# Patient Record
Sex: Male | Born: 1960 | Race: Black or African American | Hispanic: No | Marital: Married | Smoking: Never smoker
Health system: Southern US, Community
[De-identification: ages and names within clinical notes are randomized; demographics above are authoritative.]

---

## 2014-12-04 ENCOUNTER — Encounter (HOSPITAL_COMMUNITY): Payer: Self-pay | Admitting: Emergency Medicine

## 2014-12-04 ENCOUNTER — Emergency Department (HOSPITAL_COMMUNITY)
Admission: EM | Admit: 2014-12-04 | Discharge: 2014-12-04 | Disposition: A | Discharge status: Home / Self Care | Payer: 59 | Attending: Emergency Medicine | Admitting: Emergency Medicine

## 2014-12-04 ENCOUNTER — Emergency Department (HOSPITAL_COMMUNITY): Payer: 59

## 2014-12-04 DIAGNOSIS — R109 Unspecified abdominal pain: Secondary | ICD-10-CM

## 2014-12-04 DIAGNOSIS — Z87442 Personal history of urinary calculi: Secondary | ICD-10-CM

## 2014-12-04 DIAGNOSIS — R1030 Lower abdominal pain, unspecified: Secondary | ICD-10-CM | POA: Diagnosis present

## 2014-12-04 DIAGNOSIS — N200 Calculus of kidney: Secondary | ICD-10-CM | POA: Diagnosis not present

## 2014-12-04 LAB — URINALYSIS, ROUTINE W REFLEX MICROSCOPIC
Bilirubin Urine: NEGATIVE
Glucose, UA: NEGATIVE mg/dL
HGB URINE DIPSTICK: NEGATIVE
KETONES UR: NEGATIVE mg/dL
LEUKOCYTES UA: NEGATIVE
Nitrite: NEGATIVE
PROTEIN: NEGATIVE mg/dL
Specific Gravity, Urine: 1.027 (ref 1.005–1.030)
UROBILINOGEN UA: 1 mg/dL (ref 0.0–1.0)
pH: 5.5 (ref 5.0–8.0)

## 2014-12-04 LAB — COMPREHENSIVE METABOLIC PANEL
ALK PHOS: 27 U/L — AB (ref 38–126)
ALT: 19 U/L (ref 17–63)
ANION GAP: 12 (ref 5–15)
AST: 32 U/L (ref 15–41)
Albumin: 4.1 g/dL (ref 3.5–5.0)
BILIRUBIN TOTAL: 1.1 mg/dL (ref 0.3–1.2)
BUN: 10 mg/dL (ref 6–20)
CALCIUM: 9.3 mg/dL (ref 8.9–10.3)
CO2: 24 mmol/L (ref 22–32)
CREATININE: 1.59 mg/dL — AB (ref 0.61–1.24)
Chloride: 102 mmol/L (ref 101–111)
GFR, EST AFRICAN AMERICAN: 56 mL/min — AB (ref >60)
GFR, EST NON AFRICAN AMERICAN: 48 mL/min — AB (ref >60)
Glucose, Bld: 157 mg/dL — ABNORMAL HIGH (ref 65–99)
Potassium: 3.2 mmol/L — ABNORMAL LOW (ref 3.5–5.1)
Sodium: 138 mmol/L (ref 135–145)
TOTAL PROTEIN: 7.6 g/dL (ref 6.5–8.1)

## 2014-12-04 LAB — CBC
HCT: 39.3 % (ref 39.0–52.0)
HEMOGLOBIN: 13.2 g/dL (ref 13.0–17.0)
MCH: 29.7 pg (ref 26.0–34.0)
MCHC: 33.6 g/dL (ref 30.0–36.0)
MCV: 88.3 fL (ref 78.0–100.0)
PLATELETS: 278 10*3/uL (ref 150–400)
RBC: 4.45 MIL/uL (ref 4.22–5.81)
RDW: 13.3 % (ref 11.5–15.5)
WBC: 4.7 10*3/uL (ref 4.0–10.5)

## 2014-12-04 LAB — LIPASE, BLOOD: Lipase: 17 U/L — ABNORMAL LOW (ref 22–51)

## 2014-12-04 MED ORDER — MORPHINE SULFATE (PF) 4 MG/ML IV SOLN
4.0000 mg | Freq: Once | INTRAVENOUS | Status: AC
  Administered 2014-12-04: 4 mg via INTRAVENOUS
  Filled 2014-12-04: qty 1

## 2014-12-04 MED ORDER — ONDANSETRON HCL 4 MG/2ML IJ SOLN
4.0000 mg | Freq: Once | INTRAMUSCULAR | Status: AC | PRN
  Administered 2014-12-04: 4 mg via INTRAVENOUS
  Filled 2014-12-04: qty 2

## 2014-12-04 MED ORDER — OXYCODONE-ACETAMINOPHEN 5-325 MG PO TABS: 1.0000 | ORAL_TABLET | ORAL | Status: AC | PRN

## 2014-12-04 MED ORDER — KETOROLAC TROMETHAMINE 30 MG/ML IJ SOLN
30.0000 mg | Freq: Once | INTRAMUSCULAR | Status: AC
  Administered 2014-12-04: 30 mg via INTRAVENOUS
  Filled 2014-12-04: qty 1

## 2014-12-04 MED ORDER — ONDANSETRON HCL 4 MG PO TABS: 4.0000 mg | ORAL_TABLET | Freq: Four times a day (QID) | ORAL | Status: AC

## 2014-12-04 MED ORDER — POTASSIUM CHLORIDE CRYS ER 20 MEQ PO TBCR
40.0000 meq | EXTENDED_RELEASE_TABLET | Freq: Once | ORAL | Status: AC
  Administered 2014-12-04: 40 meq via ORAL
  Filled 2014-12-04: qty 2

## 2014-12-04 NOTE — ED Provider Notes (Signed)
Complains of pain in her right suprapubic area gradual onset last night. He feels much improved presently, since treatment in the emergency department. Patient is alert, Glasgow Coma Scale 15 no distress. Patient notified of abnormal creatinine, hyperglycemia and renal stone.  Doug Sou, MD 12/04/14 [phone]

## 2014-12-04 NOTE — ED Provider Notes (Signed)
CSN: [id number]     Arrival date & time 12/04/14  0617 History   First MD Initiated Contact with Patient 12/04/14 [phone]     Chief Complaint  Patient presents with  . Abdominal Pain  . Emesis   HPI  Mr. Winner is a 54 year old male with PMHx of kidney stones presenting with lower abdominal pain. Pt reports gradual onset of abdominal pain around 11 PM last evening that has been worsening in severity since. Pain is suprapubic and described as "pressure". Pain scale is 10/10. Pt states that movement makes the pain worse and he cannot get comfortable. He has a history of 2 or 3 kidney stones in the past and states that this pain feels similar. Pt is also complaining of nausea and vomiting that began with abdominal pain. Vomit is NBNB. Denies fevers, chills, back pain, flank pain, diarrhea, constipation, dysuria, decreased urinary volume, hematuria, penile discharge, penile pain or testicular pain. Pt states he gets short of breath when the pain intensifies. Denies chest pain, palpitations, dizziness or lightheadedness.   History reviewed. No pertinent past medical history. History reviewed. No pertinent past surgical history. No family history on file. Social History  Substance Use Topics  . Smoking status: Never Smoker   . Smokeless tobacco: None  . Alcohol Use: Yes     Comment: social    Review of Systems  Constitutional: Negative for fever and chills.  Respiratory: Positive for shortness of breath.   Cardiovascular: Negative for chest pain and palpitations.  Gastrointestinal: Positive for nausea, vomiting and abdominal pain. Negative for diarrhea and constipation.  Genitourinary: Negative for dysuria, frequency, hematuria, flank pain, decreased urine volume, discharge, difficulty urinating, penile pain and testicular pain.  Musculoskeletal: Negative for myalgias and back pain.  Neurological: Negative for dizziness and light-headedness.      Allergies  Bactrim  Home Medications    Prior to Admission medications   Medication Sig Start Date End Date Taking? Authorizing Provider  ondansetron (ZOFRAN) 4 MG tablet Take 1 tablet (4 mg total) by mouth every 6 (six) hours. 12/04/14   Stevi Barrett, PA-C  oxyCODONE-acetaminophen (PERCOCET/ROXICET) 5-325 MG per tablet Take 1-2 tablets by mouth every 4 (four) hours as needed for severe pain. 12/04/14   Stevi Barrett, PA-C   BP 134/66 mmHg  Pulse 71  Temp(Src) 98.3 F (36.8 C) (Oral)  Resp 21  Ht  (1.905 m)  Wt 240 lb (108.863 kg)  BMI 30.00 kg/m2  SpO2 100% Physical Exam  Constitutional: He appears well-developed and well-nourished. He appears distressed (Uncomfortable appearing).  HENT:  Head: Normocephalic and atraumatic.  Eyes: Conjunctivae are normal. Right eye exhibits no discharge. Left eye exhibits no discharge. No scleral icterus.  Neck: Normal range of motion.  Cardiovascular: Normal rate, regular rhythm and normal heart sounds.   No murmur heard. Pulmonary/Chest: Effort normal and breath sounds normal. No respiratory distress. He has no wheezes. He has no rales.  Tachypneic   Abdominal: Soft. Bowel sounds are normal. He exhibits no distension. There is tenderness (suprapubic, RLQ). There is no rebound and no guarding.  No CVA tenderness.   Musculoskeletal: Normal range of motion.  Moves all extremities spontaneously.  Neurological: He is alert. Coordination normal.  Skin: Skin is warm and dry.  Psychiatric: He has a normal mood and affect. His behavior is normal.  Nursing note and vitals reviewed.   ED Course  Procedures (including critical care time) Labs Review Labs Reviewed  LIPASE, BLOOD - Abnormal; Notable for  the following:    Lipase 17 (*)    All other components within normal limits  COMPREHENSIVE METABOLIC PANEL - Abnormal; Notable for the following:    Potassium 3.2 (*)    Glucose, Bld 157 (*)    Creatinine, Ser 1.59 (*)    Alkaline Phosphatase 27 (*)    GFR calc non Af Amer 48 (*)     GFR calc Af Amer 56 (*)    All other components within normal limits  URINALYSIS, ROUTINE W REFLEX MICROSCOPIC (NOT AT [facility]) - Abnormal; Notable for the following:    Color, Urine AMBER (*)    APPearance CLOUDY (*)    All other components within normal limits  CBC    Imaging Review Ct Renal Stone Study  12/04/2014   CLINICAL DATA:  C/o lower central abdominal pain most of the night, no worse on either side per pt, no gross hematuria, urinalysis pending at time of ct, hx of kidney stones  EXAM: CT ABDOMEN AND PELVIS WITHOUT CONTRAST  TECHNIQUE: Multidetector CT imaging of the abdomen and pelvis was performed following the standard protocol without IV contrast.  COMPARISON:  None.  FINDINGS: Lower chest:  Negative  Hepatobiliary: Multiple low-attenuation lesions throughout the liver which cannot be fully characterized without contrast. Most are less than a cm in size. In the medial left lobe the dominant lesion is 2.4 cm. Gallbladder is normal.  Pancreas: Normal  Spleen: Normal  Adrenals/Urinary Tract: Normal adrenal glands. Normal left kidney. Right kidney shows mild perinephric inflammation. There is a 4 mm nonobstructing upper pole stone. There is moderate dilatation of the right ureter. There is a 7 mm stone in the distal right ureter about 1.5 cm proximal to the ureterovesical junction. Bladder is decompressed.  Stomach/Bowel: Normal  Vascular/Lymphatic: Normal  Reproductive: Normal  Other: No ascites  Musculoskeletal: No acute findings  IMPRESSION: Hydronephrosis on the right due to distal ureteral stone.  Multiple liver lesions likely cysts but this would require contrast-enhanced study to characterize. Recommend contrast-enhanced CT scan or nonemergent hepatic MRI.   Electronically Signed   By: Esperanza Heir M.D.   On: 12/04/2014 07:16   I have personally reviewed and evaluated these images and lab results as part of my medical decision-making.   EKG Interpretation None      MDM    Final diagnoses:  History of kidney stones  Abdominal pain  Nephrolithiasis   6:30 - Pt presenting with 8 hour history of worsening suprapubic pain with nausea and vomiting. Pt with history of kidney stones. VSS. Pt very uncomfortable appearing, frequently moving around on the stretcher. Abdomen is soft with suprapubic and RLQ tenderness. Will get CBC, CMP, lipase, UA and CT renal stone study. Zofran, toradol and morphine given for symptom control. 7:15 - CT renal study showing hydronephrosis of right kidney 2/2 distal 7 mm ureteral stone. Reassessed pt at this time. Reports significant improvement in pain after toradol injection. Abdomen is soft with mild tenderness over suprapubic/RLQ. Creatinine 1.59. Pt is not uroseptic, anuric or with intractable pain/nausea. Will discharge with percocet, zofran, strainer and urology referral. Pt instructed to call urology later today to schedule follow up appointment. Pt is moving to [place] next week, encouraged to establish primary care there for a recheck of kidney function in the next month. Pt agrees with this plan. Stable for discharge.   Rolm Gala Barrett, PA-C 12/04/14 1610  Doug Sou, MD 12/04/14 [phone]

## 2014-12-04 NOTE — Discharge Instructions (Signed)
- Call urology today to schedule a follow up appointment for this week - Establish primary care doctor ASAP. Today your creatinine was 1.58 and glucose was 157. Ask your primary care doctor to recheck your kidney function and get a Hemoglobin A1C within the next month.  Emergency Department Resource Guide 1) Find a Doctor and Pay Out of Pocket Although you won't have to find out who is covered by your insurance plan, it is a good idea to ask around and get recommendations. You will then need to call the office and see if the doctor you have chosen will accept you as a new patient and what types of options they offer for patients who are self-pay. Some doctors offer discounts or will set up payment plans for their patients who do not have insurance, but you will need to ask so you aren't surprised when you get to your appointment.  2) Contact Your Local Health Department Not all health departments have doctors that can see patients for sick visits, but many do, so it is worth a call to see if yours does. If you don't know where your local health department is, you can check in your phone book. The CDC also has a tool to help you locate your state's health department, and many state websites also have listings of all of their local health departments.  3) Find a Walk-in Clinic If your illness is not likely to be very severe or complicated, you may want to try a walk in clinic. These are popping up all over the country in pharmacies, drugstores, and shopping centers. They're usually staffed by nurse practitioners or physician assistants that have been trained to treat common illnesses and complaints. They're usually fairly quick and inexpensive. However, if you have serious medical issues or chronic medical problems, these are probably not your best option.  No Primary Care Doctor: - Call Health Connect at  [phone] - they can help you locate a primary care doctor that  accepts your insurance, provides  certain services, etc. - Physician Referral Service- [phone]  Chronic Pain Problems: Organization         Address  Phone   Notes  [facility]  [phone] Patients need to be referred by their primary care doctor.   Medication Assistance: Organization         Address  Phone   Notes  [facility] Medication [facility] [address] [place] [place], [place] [address] [phone] --Must be a resident of [facility] -- Must have NO insurance coverage whatsoever (no Medicaid/ Medicare, etc.) -- The pt. MUST have a primary care doctor that directs their care regularly and follows them in the community   [facility]  [phone]   [employer]  [phone]    Agencies that provide inexpensive medical care: Organization         Address  Phone   Notes  [facility]  [phone]   [facility]    [phone]   [facility] [address] [place], [place] [address] [phone]   [facility] [address] [place] [place] [phone]   Planned Parenthood    [phone]   [facility]    [phone]   Community Health and [facility]  [address], [place] Phone:  [phone], Fax:  [phone] Hours of Operation:  9 am - 6 pm,  M-F.  Also accepts Medicaid/Medicare and self-pay.  [facility]  [address], [place] Phone: [phone], Fax: [phone]. Hours of Operation:  8:30 am - 5:30 pm, M-F.  Also accepts Medicaid and self-pay.  [facility] [place] [address], [place] Phone: [phone]   [facility] [address], [place], [place] [phone], Ext. [phone] Mondays & Thursdays: 7-9 AM.  First 15 patients are seen on a first come, first serve basis.    Medicaid-accepting [facility] Providers:  Organization         Address  Phone   Notes  [facility] [address], [place] [phone]) [phone] Also accepts self-pay patients.  [facility] [address], [place]  [phone]) [phone]   [facility] [address], [place] [phone]   [facility] [address] [place] [address], [place] [phone]) [phone]   Renaye RakersVeita Bland [address], [place]   [phone] Only accepts [place] Access [place] patients after they have their name applied to their card.   Self-Pay (no insurance) in [facility]:  Organization         Address  Phone   Notes  Sickle Cell Patients, [facility] [address], [place] [phone]) [phone]   [facility] [address], [place] [phone]) [phone]   [facility]  [address], [place] [phone]) [phone]   [facility]/Dr. Osei-Bonsu  [address] [place] [address], [place] or [phone], [place] [phone] Phone number for both [place] and [place] locations is the same.  [facility]gent [facility] and [facility] [address], [place] [phone]   [facility] [address] [place] [address], [place] or [address] [phone](336) [phone]   [facility] [address], [place] [phone]) [phone], phone; [phone], fax Sees patients 1st and 3rd Saturday of every month.  Must not qualify for public or private insurance (i.e. Medicaid, Medicare, [place] Health Choice, Veterans' Benefits)  Household income should be no more than 200% of the poverty level The clinic cannot treat you if you are pregnant or think you are pregnant  Sexually transmitted diseases are not treated at the clinic.    Dental Care: Organization         Address  Phone  Notes  [facility] [address], [place] [phone]  Accepts children up to age 53 who are enrolled in [place] or [place] Health Choice; pregnant women with a Medicaid card; and children who have applied for Medicaid or [place] Health Choice, but were declined, whose parents can pay a reduced fee at time of service.  [facility] of [facility] [place]  [address], [place] [phone]) [phone] Accepts children up to age 53 who are enrolled in [place] or [place] Health Choice; pregnant women with a Medicaid card; and children who have applied for Medicaid or [place] Health Choice, but were declined, whose parents can pay a reduced fee at time of service.  [facility]  [address], [place] [phone] Patients are seen by appointment only. Walk-ins are not accepted. [facility] will see patients 53 years of age and older. Monday -  Tuesday (8am-5pm) Most Wednesdays (8:30-5pm) $30 per visit, cash only  [facility]  [address], [facility] [phone]) [phone] Patients are seen by appointment only. Walk-ins are not accepted. [facility] will see patients 53 years of age and older. One Wednesday Evening (Monthly: Volunteer Based).  $30 per visit, cash only  [employer] [facility] [facility]  [phone] for adults; Children under age 954, call Graduate Pediatric Dentistry at [phone]) [phone]. Children aged 274-14, please call [phone] to request a pediatric application.  Dental services are provided in all areas of dental care including fillings, crowns and bridges, complete and partial dentures, implants, gum treatment, root canals, and extractions. Preventive care is also provided. Treatment is provided to both adults and children. Patients are selected via a lottery and there is often a waiting list.   [facility] [address], [place]  [phone]) [phone] [email or web address]   [facility] [address], [place], [place] [phone], Ext. [phone] Second  and Fourth Thursday of each month, opens at 6:30 AM; Clinic ends at 9 AM.  Patients are seen on a first-come first-served basis, and a limited number are seen during each clinic.   [facility]  [address], [place], [place] [phone]) [phone]   Eligibility Requirements You must have lived in [place], [place], or [place] counties for at least the last three months.   You cannot be eligible for state or federal sponsored National Cityhealthcare insurance, including CIGNAVeterans Administration, [place]icaid, or [employer] EntertainmentMedicare.   You generally cannot be eligible for healthcare insurance through your employer.    How to apply: Eligibility screenings are held every Tuesday and Wednesday afternoon from 1:00 pm until 4:00 pm. You do not need an appointment for the interview!  [facility] [address], [place], [place] [phone]   [facility]  [phone]   [facility]  [phone]   [facility]  [phone]    Behavioral Health Resources in the Community: Intensive Outpatient Programs Organization         Address  Phone  Notes  [facility] [address], [place], [place] [phone]   [facility] [address], [place], [place] [phone]   [facility] [address], [place], [place]  [phone]   [facility] [address],  [place], [place] [phone] or [phone]   Substance Abuse Resources Organization         Address  Phone  Notes  [facility]  [phone]   [facility]  [phone]   [facility]  [phone]   [facility]  [phone]   Residential & Outpatient Substance Abuse Program  [phone]   Psychological Services Organization         Address  Phone  Notes  [facility]  [phone]- [phone]   [facility]  [phone]- [phone]   [facility] [address], [place] [phone] or [phone]    Mobile Crisis Teams Organization         Address  Phone  Notes  [facility], Mobile Crisis Care Unit  [phone]   [facility]  [address] [place], [place] [phone]   Doristine LocksSharon DeEsch [address] [place] [phone]    Self-Help/Support Groups Organization         Address  Phone  Notes  [facility] [place] - variety of support groups  [phone]- [phone] Call for more information  Narcotics Anonymous (NA), [facility] [address], [employer] [place]  2 meetings at this location   Statistician         Address  Phone  Notes  [facility] [address],    [place]  [phone]   [facility]  [address], [place] [address], [place], [place] [phone]   [facility] [address] [place], [place] [phone] Admissions: 8am-3pm M-F  [facility] [address],    [place], [place] [phone]   [facility] [address] [place], [place], [place] [phone]   [facility] [address],  [place], [place] [phone]   [facility] - Intensive Outpatient [address], [place], [place] [phone]   [facility] ([facility] [address]  [place], [place] [phone] or [phone]   [facility] ([facility]) [address], [place], [place] [phone] Accepts Medicaid  [facility] [address],  [place] [phone] Substance Abuse/Addiction Treatment   [facility] Organization         Address  Phone  Notes  [facility]  [phone]   Angie Fava, PhD [address] [place], [place]   [phone] or [phone]   [facility]   [address] [place], [place]  [phone]   [facility] [address] [place], [place] [phone] Insurance/Medicaid/sponsorship through [facility] [address]                                    [place], [place] [phone] Therapy/tele-psych/case  [facility] [address], [place] [phone]    Dr. Lolly Mustache  [phone]   [facility] [place]  [employer] [facility] Dept. 1) [address] [place] 2) [address] [place] 3)  [address], [place] [phone]  [phone]   [facility] Child Abuse Hotline [phone] or [phone] (After Hours)       Kidney Stones Kidney stones (urolithiasis) are deposits that form inside your kidneys. The intense pain is caused by the stone moving through the urinary tract. When the stone moves, the ureter goes into spasm around the stone. The stone is usually passed in the urine.  CAUSES   A disorder that makes certain neck glands produce too much parathyroid hormone (primary hyperparathyroidism).  A buildup of uric acid crystals, similar to gout in your joints.  Narrowing (stricture) of the ureter.  A kidney obstruction present at birth (congenital obstruction).  Previous surgery on the kidney or ureters.  Numerous kidney infections. SYMPTOMS   Feeling sick to your stomach (nauseous).  Throwing up (vomiting).  Blood in the urine (hematuria).  Pain that usually spreads (radiates) to the groin.  Frequency or urgency of urination. DIAGNOSIS   Taking a history and physical exam.  Blood or urine tests.  CT scan.  Occasionally, an examination of the inside of the urinary bladder (cystoscopy) is performed. TREATMENT   Observation.  Increasing your fluid intake.  Extracorporeal shock wave lithotripsy--This is a noninvasive procedure that uses shock waves to break up  kidney stones.  Surgery may be needed if you have severe pain or  persistent obstruction. There are various surgical procedures. Most of the procedures are performed with the use of small instruments. Only small incisions are needed to accommodate these instruments, so recovery time is minimized. The size, location, and chemical composition are all important variables that will determine the proper choice of action for you. Talk to your health care provider to better understand your situation so that you will minimize the risk of injury to yourself and your kidney.  HOME CARE INSTRUCTIONS   Drink enough water and fluids to keep your urine clear or pale yellow. This will help you to pass the stone or stone fragments.  Strain all urine through the provided strainer. Keep all particulate matter and stones for your health care provider to see. The stone causing the pain may be as small as a grain of salt. It is very important to use the strainer each and every time you pass your urine. The collection of your stone will allow your health care provider to analyze it and verify that a stone has actually passed. The stone analysis will often identify what you can do to reduce the incidence of recurrences.  Only take over-the-counter or prescription medicines for pain, discomfort, or fever as directed by your health care provider.  Make a follow-up appointment with your health care provider as directed.  Get follow-up X-rays if required. The absence of pain does not always mean that the stone has passed. It may have only stopped moving. If the urine remains completely obstructed, it can cause loss of kidney function or even complete destruction of the kidney. It is your responsibility to make sure X-rays and follow-ups are completed. Ultrasounds of the kidney can show blockages and the status of the kidney. Ultrasounds are not associated with any radiation and can be performed easily in a matter of minutes. SEEK MEDICAL CARE IF:  You experience pain that is progressive and  unresponsive to any pain medicine you have been prescribed. SEEK IMMEDIATE MEDICAL CARE IF:   Pain cannot be controlled with the prescribed medicine.  You have a fever or shaking chills.  The severity or intensity of pain increases over 18 hours and is not relieved by pain medicine.  You develop a new onset of abdominal pain.  You feel faint or pass out.  You are unable to urinate. MAKE SURE YOU:   Understand these instructions.  Will watch your condition.  Will get help right away if you are not doing well or get worse. Document Released: 03/03/2005 Document Revised: 11/03/2012 Document Reviewed: 08/04/2012 [facility] Patient Information 2015 [place], [place]. This information is not intended to replace advice given to you by your health care provider. Make sure you discuss any questions you have with your health care provider.

## 2014-12-04 NOTE — ED Notes (Addendum)
Pt c/o lower abdominal pain and vomiting that has been going on all night. Hx of kidney stones.  Pt diaphoretic and large episode of emesis in lobby

## 2016-06-18 IMAGING — CT CT RENAL STONE PROTOCOL
2 of 3 series · 16 of 31 positions shown, 18 images · non-contrast
Comparison: None.

CLINICAL DATA: C/o lower central abdominal pain most of the night,
no worse on either side per pt, no gross hematuria, urinalysis
pending at time of ct, hx of kidney stones

EXAM:
CT ABDOMEN AND PELVIS WITHOUT CONTRAST
TECHNIQUE: Multidetector CT imaging of the abdomen and pelvis was performed
following the standard protocol without IV contrast.

[Series 3: lung · axial · 0.74mm/px · z∈[+1540,+1610]mm · 13 of 17 slices shown, 15 images]
[im 2/17  soft-tissue]
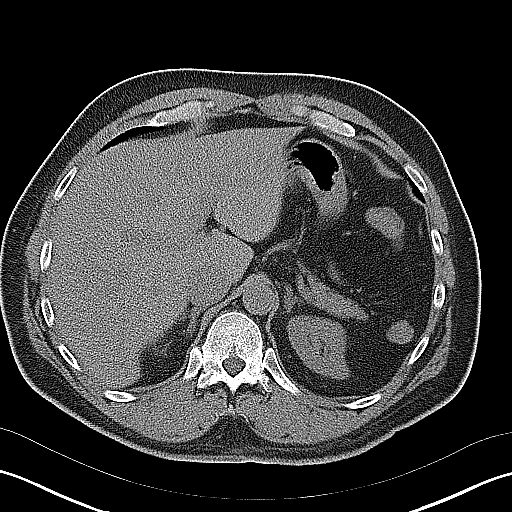
[im 2/17  bone]
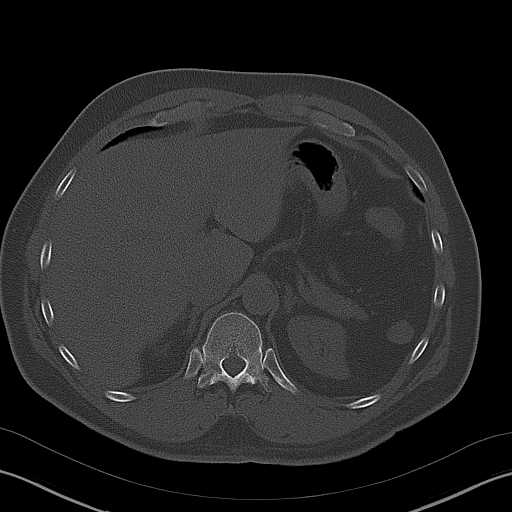
[im 3/17  soft-tissue]
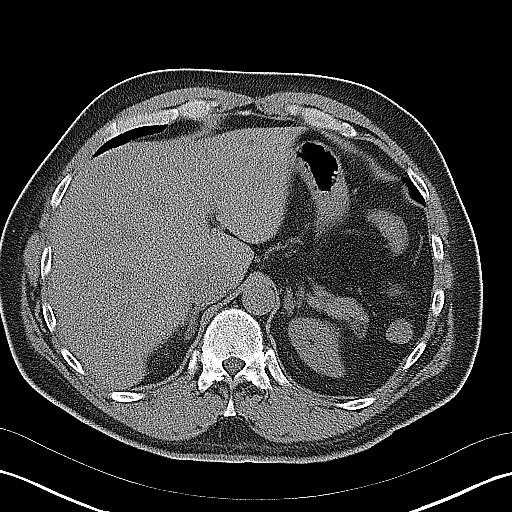
[im 4/17  soft-tissue]
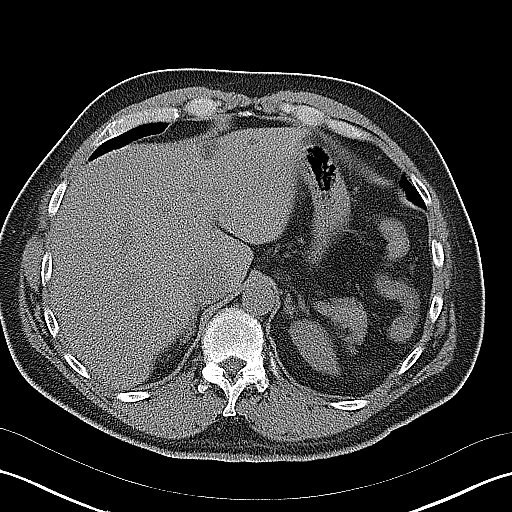
[im 6/17  soft-tissue]
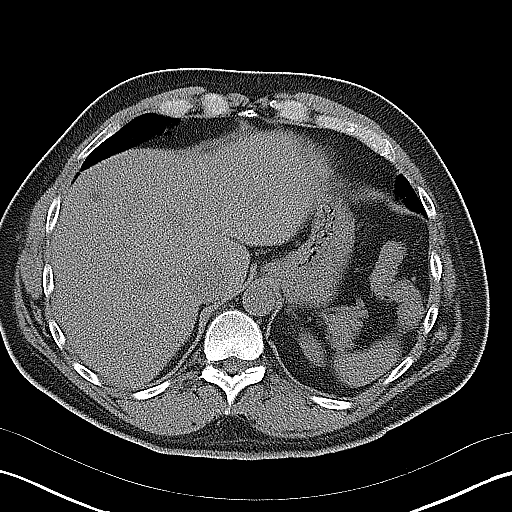
[im 7/17  soft-tissue]
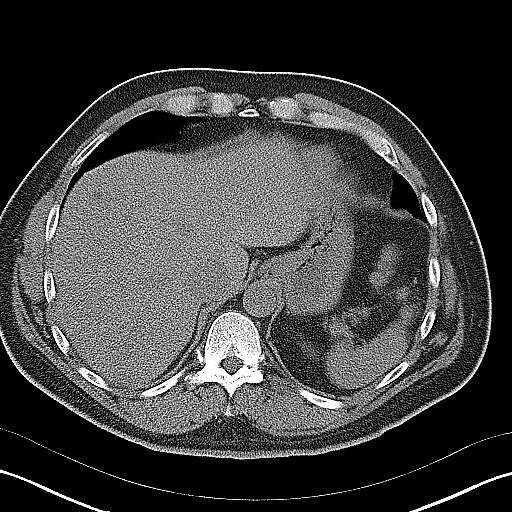
[im 8/17  soft-tissue]
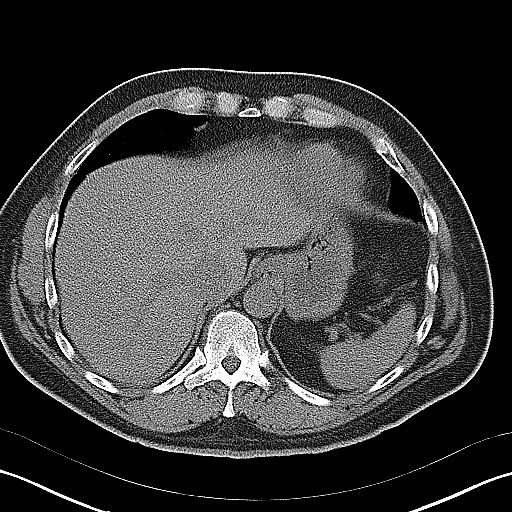
[im 9/17  soft-tissue]
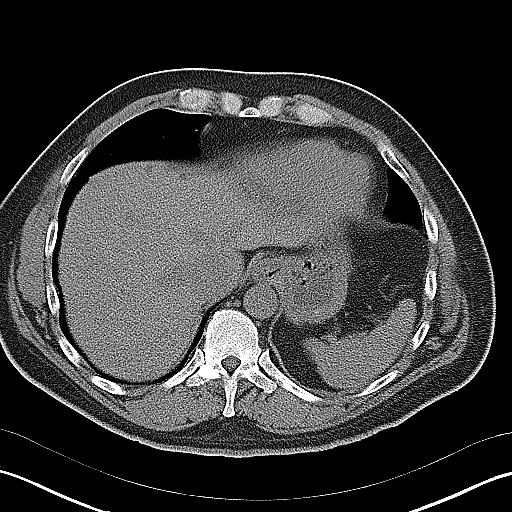
[im 10/17  soft-tissue]
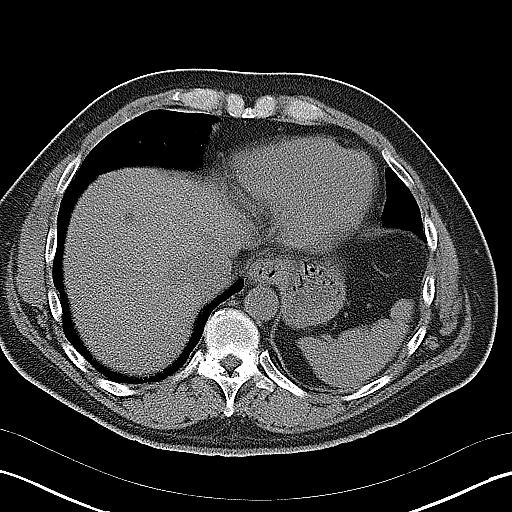
[im 11/17  soft-tissue]
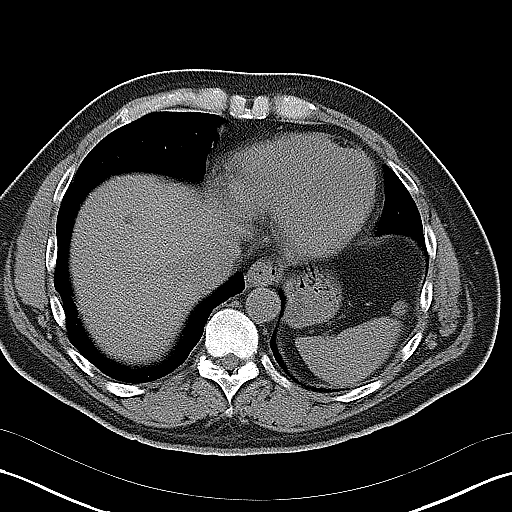
[im 11/17  bone]
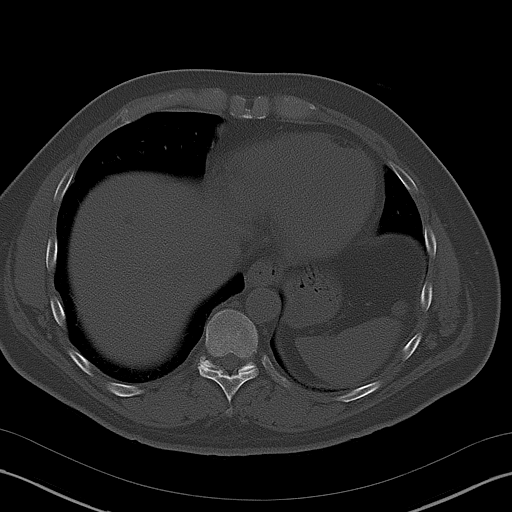
[im 12/17  soft-tissue]
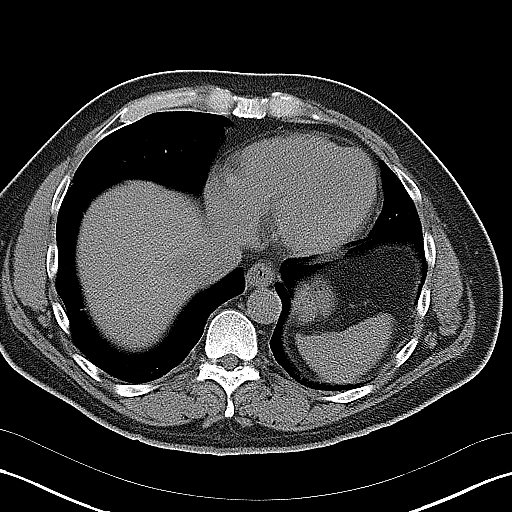
[im 14/17  soft-tissue]
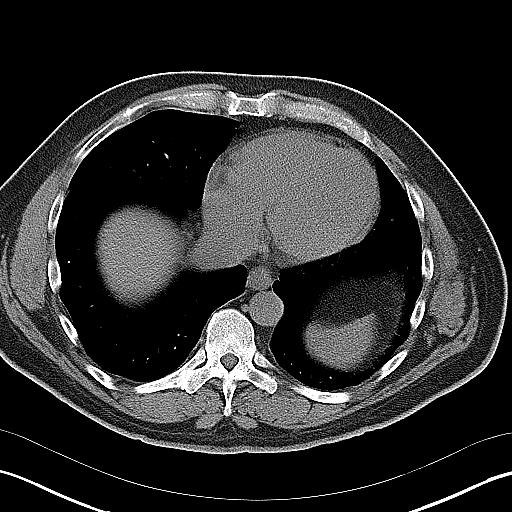
[im 15/17  soft-tissue]
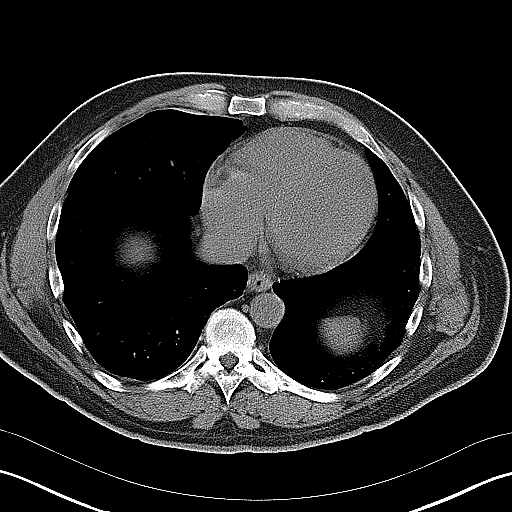
[im 16/17  soft-tissue]
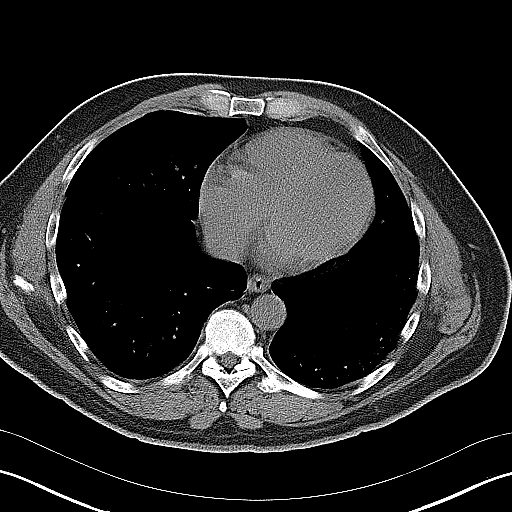

[Series 4: coronal · coronal · 0.68mm/px · 3 of 100 slices shown]
[im 34/100  soft-tissue]
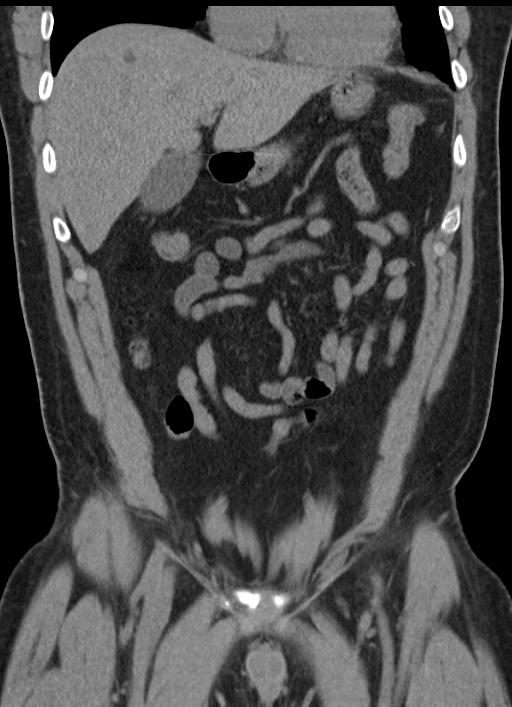
[im 45/100  soft-tissue]
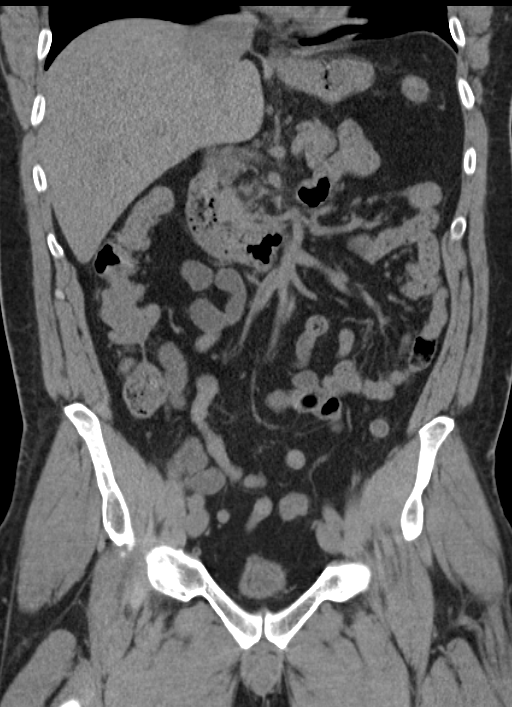
[im 56/100  soft-tissue]
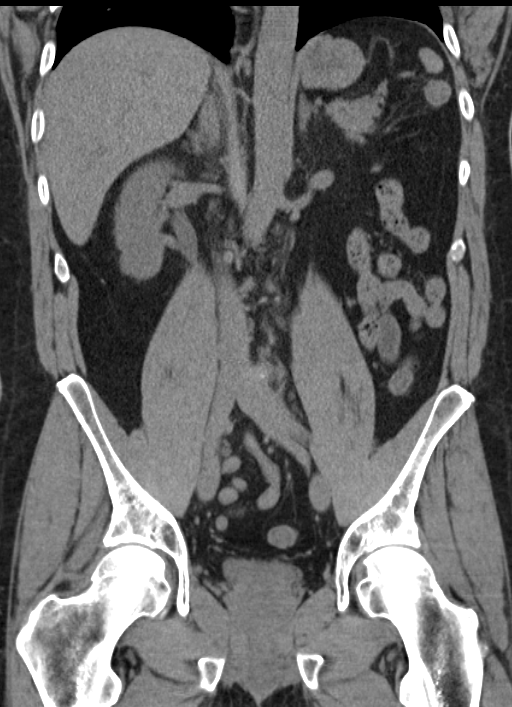

[16 of 31 positions shown; findings below may reference images not displayed]

FINDINGS: Lower chest:  Negative

Hepatobiliary: Multiple low-attenuation lesions throughout the liver
which cannot be fully characterized without contrast. Most are less
than a cm in size. In the medial left lobe the dominant lesion is
2.4 cm. Gallbladder is normal.

Pancreas: Normal

Spleen: Normal

Adrenals/Urinary Tract: Normal adrenal glands. Normal left kidney.
Right kidney shows mild perinephric inflammation. There is a 4 mm
nonobstructing upper pole stone. There is moderate dilatation of the
right ureter. There is a 7 mm stone in the distal right ureter about
1.5 cm proximal to the ureterovesical junction. Bladder is
decompressed.

Stomach/Bowel: Normal

Vascular/Lymphatic: Normal

Reproductive: Normal

Other: No ascites

Musculoskeletal: No acute findings
IMPRESSION: Hydronephrosis on the right due to distal ureteral stone.

Multiple liver lesions likely cysts but this would require
contrast-enhanced study to characterize. Recommend contrast-enhanced
CT scan or nonemergent hepatic MRI.
